# Patient Record
Sex: Male | Born: 1990 | Race: Black or African American | Hispanic: No | Marital: Single | Smoking: Smoker, current status unknown
Health system: Southern US, Community
[De-identification: ages and names within clinical notes are randomized; demographics above are authoritative.]

## PROBLEM LIST (undated history)

## (undated) DIAGNOSIS — Z789 Other specified health status: Secondary | ICD-10-CM

## (undated) HISTORY — PX: OTHER SURGICAL HISTORY: SHX169

---

## 2014-12-18 ENCOUNTER — Inpatient Hospital Stay (HOSPITAL_COMMUNITY)

## 2014-12-18 ENCOUNTER — Encounter (HOSPITAL_COMMUNITY): Payer: Self-pay | Admitting: Family Medicine

## 2014-12-18 ENCOUNTER — Inpatient Hospital Stay (HOSPITAL_COMMUNITY)
Admission: AD | Admit: 2014-12-18 | Discharge: 2014-12-18 | Disposition: A | Discharge status: Home / Self Care | Source: Ambulatory Visit | Attending: Family Medicine | Admitting: Family Medicine

## 2014-12-18 DIAGNOSIS — M25512 Pain in left shoulder: Secondary | ICD-10-CM

## 2014-12-18 DIAGNOSIS — S43085A Other dislocation of left shoulder joint, initial encounter: Secondary | ICD-10-CM | POA: Insufficient documentation

## 2014-12-18 DIAGNOSIS — X500XXA Overexertion from strenuous movement or load, initial encounter: Secondary | ICD-10-CM | POA: Insufficient documentation

## 2014-12-18 DIAGNOSIS — S43015A Anterior dislocation of left humerus, initial encounter: Secondary | ICD-10-CM

## 2014-12-18 DIAGNOSIS — Y929 Unspecified place or not applicable: Secondary | ICD-10-CM | POA: Insufficient documentation

## 2014-12-18 HISTORY — DX: Other specified health status: Z78.9

## 2014-12-18 MED ORDER — OXYCODONE-ACETAMINOPHEN 5-325 MG PO TABS
1.0000 | ORAL_TABLET | Freq: Once | ORAL | Status: AC
  Administered 2014-12-18: 1 via ORAL
  Filled 2014-12-18: qty 1

## 2014-12-18 MED ORDER — DIAZEPAM 5 MG/ML IJ SOLN
5.0000 mg | Freq: Once | INTRAMUSCULAR | Status: AC
Start: 2014-12-18 — End: 2014-12-18
  Administered 2014-12-18: 5 mg via INTRAVENOUS
  Filled 2014-12-18: qty 2

## 2014-12-18 MED ORDER — FENTANYL CITRATE (PF) 100 MCG/2ML IJ SOLN
50.0000 ug | Freq: Once | INTRAMUSCULAR | Status: AC
  Administered 2014-12-18: 50 ug via INTRAMUSCULAR
  Filled 2014-12-18: qty 2

## 2014-12-18 NOTE — Procedures (Deleted)
Time out performed. Patient premedicated with Valium 5mg  IM and Fentanyl 50mg   Placed in supine position. Left arm flexed at the elbow. Slow external rotation of the left shoulder. Audible pop with alleviation of pain after approximately 5 minutes of gentle pressure.   Patient able to fully flex and extend shoulder passively and actively after procedure. Neurovascularly intact.   Federico FlakeKimberly Niles Laiken Nohr, MD

## 2014-12-18 NOTE — MAU Note (Addendum)
Hurt shoulder at work. Swinging a Radio broadcast assistantsledge hammer.

## 2014-12-18 NOTE — MAU Provider Note (Signed)
CSN: [id number]     Arrival date & time 12/18/14  1626 History   Chief Complaint  Patient presents with  . Shoulder Injury   First Provider Initiated Contact with Patient 12/18/14 1708    HPI Christopher Perry is a 24 y.o. male presenting with left shoulder pain. He was breaking up concrete with a sledge hammer. He was coming down on the concrete and suddenly felt a pop and severe pain in his right shoulder. This has never happened before. No associated numbness and tingling. He reports inability to move arm 2/2 to pain. Sensation intact. He reports that the injury occurred 20 minutes prior to arrival.  He has not tried anything. His friend drove him directly here to [facility] hospital which was the closest hospital.    Past Medical History  Diagnosis Date  . Medical history non-contributory    Past Surgical History  Procedure Laterality Date  . None     Family History  Problem Relation Age of Onset  . Arthritis Father    Social History  Substance Use Topics  . Smoking status: Smoker, Current Status Unknown -- 1.00 packs/day for 5 years    Types: Cigarettes  . Smokeless tobacco: None  . Alcohol Use: 0.0 oz/week    0 Standard drinks or equivalent per week    Review of Systems  Constitutional: Negative for fever and chills.  HENT: Negative for congestion and sore throat.   Eyes: Negative for pain and visual disturbance.  Respiratory: Negative for cough, chest tightness and shortness of breath.   Cardiovascular: Negative for chest pain.  Gastrointestinal: Negative for nausea, vomiting, abdominal pain and diarrhea.  Endocrine: Negative for cold intolerance and heat intolerance.  Genitourinary: Negative for dysuria and flank pain.  Musculoskeletal: Negative for back pain and arthralgias.       +left  Shoulder pain  Skin: Negative for rash.  Allergic/Immunologic: Negative for food allergies.  Neurological: Negative for dizziness, weakness, light-headedness and numbness.   Psychiatric/Behavioral: Negative for agitation.    Allergies  Review of patient's allergies indicates no known allergies.  Home Medications   Prior to Admission medications   Not on File   BP 126/87 mmHg  Pulse 89  Temp(Src) 98.2 F (36.8 C) (Oral)  Resp 20  SpO2 100% Physical Exam Gen: Young AA male in acute distress, holding left arm with right. Well nourished.  CV: RR Lung: normal WOB  MSK:  Right shoulder: normal ROM. No TPP.  Left shoulder:  Loss of normal rounded nature to left shoulder. TTP anterior to shoulder. Decreased ROM, unable to flex/ext left shoulder. Able to flex/ext left elbow, wrist. Neurovascularly intact distal to injury.   ED Course  Procedures (including critical care time) CLOSED SHOULDER REDUCTION- see separate procedure note  Critical Care Total Time: 15 Minutes Critical care that was provided during the described time interval was provided by me and/or other providers on the [facility] team. This included time spent in examining the patient at the bedside, reviewing medical history and events of note, performing physical examination of the patient, evaluating lab work and other data, coordinating patient care on the floor or bedside, talking to patient/family members, communicating with nursing staff on the floor and subspecialists to coordinate this patient's complex medical care.   This patient is medically complex and is at high risk for sudden clinically significant deterioration which could be life threatening, and requires high complexity decision making. Federico Flake, MD   12/18/2014 6:19 PM   Labs  Review Labs Reviewed - No data to display  Imaging Review  DG Shoulder Left- Pre-reduction 12/18/2014  CLINICAL DATA:  Left shoulder injury while swinging a sledgehammer. Severe left shoulder pain. Initial encounter. EXAM: LEFT SHOULDER - 2+ VIEW COMPARISON:  None. FINDINGS: The left shoulder is anteriorly dislocated. No  fracture is identified. The acromioclavicular joint is intact. Imaged left lung and ribs are clear. IMPRESSION: Anterior left shoulder dislocation. Electronically Signed   By: Drusilla Kannerhomas  Dalessio M.D.   On: 12/18/2014 17:42   12/18/2014 7:15 PM  Official read not available.  Personally reviewed XR which shows that left shoulder is now in the appropriate anatomical location. No obvious deformities or fractures.    I have personally reviewed and evaluated these images and lab results as part of my medical decision-making.   MDM   Final diagnoses:  Anterior shoulder dislocation, left, initial encounter   Patient presented with classic s/sx and physical exam consistent with anterior shoulder dislocation. XR confirmed dislocation. Patient was medication and reduction performed in the ED. Patient with immediate relief after procedure. Repeat XR showed successful reduction. Patient sent home and instructed to use tylenol and ice prn. Reviewed activity precautions and PT for shoulder. Re[facility] in 2-3 weeks to assure he is healing well.

## 2014-12-18 NOTE — Discharge Instructions (Signed)
You can take Tylenol 500mg  every 6 hours as needed for pain Ice can also be very helpful Limit use of your shoulder for 1 week  Do not return to normal activities for 2 weeks After this two weeks you can start doing the activities below Please call [facility] to assure you are healing well.   You were given multiple strong medications. You should NOT drink alcohol for at least 24 hours.   Shoulder Dislocation A shoulder dislocation happens when the upper arm bone (humerus) moves out of the shoulder joint. The shoulder joint is the part of the shoulder where the humerus, shoulder blade (scapula), and collarbone (clavicle) meet. CAUSES This condition is often caused by:  A fall.  A hit to the shoulder.  A forceful movement of the shoulder. RISK FACTORS This condition is more likely to develop in people who play sports. SYMPTOMS Symptoms of this condition include:  Deformity of the shoulder.  Intense pain.  Inability to move the shoulder.  Numbness, weakness, or tingling in your neck or down your arm.  Bruising or swelling around your shoulder. DIAGNOSIS This condition is diagnosed with a physical exam. After the exam, tests may be done to check for related problems. Tests that may be done include:  X-ray. This may be done to check for broken bones.  MRI. This may be done to check for damage to the tissues around the shoulder.  Electromyogram. This may be done to check for nerve damage. TREATMENT This condition is treated with a procedure to place the humerus back in the joint. This procedure is called a reduction. There are two types of reduction:  Closed reduction. In this procedure, the humerus is placed back in the joint without surgery. The health care provider uses his or her hands to guide the bone back into place.  Open reduction. In this procedure, the humerus is placed back in the joint with surgery. An open reduction may be recommended if:  You have a  weak shoulder joint or weak ligaments.  You have had more than one shoulder dislocation.  The nerves or blood vessels around your shoulder have been damaged. After the humerus is placed back into the joint, your arm will be placed in a splint or sling to prevent it from moving. You will need to wear the splint or sling until your shoulder heals. When the splint or sling is removed, you may have physical therapy to help improve the range of motion in your shoulder joint. HOME CARE INSTRUCTIONS If You Have a Splint or Sling:  Wear it as told by your health care provider. Remove it only as told by your health care provider.  Loosen it if your fingers become numb and tingle, or if they turn cold and blue.  Keep it clean and dry. Bathing  Do not take baths, swim, or use a hot tub until your health care provider approves. Ask your health care provider if you can take showers. You may only be allowed to take sponge baths for bathing.  If your health care provider approves bathing and showering, cover your splint or sling with a watertight plastic bag to protect it from water. Do not let the splint or sling get wet. Managing Pain, Stiffness, and Swelling  If directed, apply ice to the injured area.  Put ice in a plastic bag.  Place a towel between your skin and the bag.  Leave the ice on for 20 minutes, 2-3 times per day.  Move your fingers often to avoid stiffness and to decrease swelling.  Raise (elevate) the injured area above the level of your heart while you are sitting or lying down. Driving  Do not drive while wearing a splint or sling on a hand that you use for driving.  Do not drive or operate heavy machinery while taking pain medicine. Activity  Return to your normal activities as told by your health care provider. Ask your health care provider what activities are safe for you.  Perform range-of-motion exercises only as told by your health care provider.  Exercise your  hand by squeezing a soft ball. This helps to decrease stiffness and swelling in your hand and wrist. General Instructions  Take over-the-counter and prescription medicines only as told by your health care provider.  Do not use any tobacco products, including cigarettes, chewing tobacco, or e-cigarettes. Tobacco can delay bone and tissue healing. If you need help quitting, ask your health care provider.  Keep all follow-up visits as told by your health care provider. This is important. SEEK MEDICAL CARE IF:  Your splint or sling gets damaged. SEEK IMMEDIATE MEDICAL CARE IF:  Your pain gets worse rather than better.  You lose feeling in your arm or hand.  Your arm or hand becomes white and cold.   This information is not intended to replace advice given to you by your health care provider. Make sure you discuss any questions you have with your health care provider.   Document Released: 09/16/2000 Document Revised: 09/12/2014 Document Reviewed: 04/16/2014 [employer] Interactive Patient Education 2016 [employer]  Anterior Shoulder Instability With Rehab Anterior shoulder instability is a condition that is characterized by recurrent dislocation of the shoulder joint. Dislocation is an injury in which two adjacent bones are no longer in proper alignment, and the joint surfaces are no longer touching. Subluxation is a similar injury to dislocation; however, the joint surfaces are still touching. Dislocations and subluxations of the shoulder joint (glenohumeral) most commonly involve the upper arm bone (humerus) displacing forward (anteriorly). The shoulder joint allows more motion than any other joint in the body, and because of this it is highly susceptible to injury. When the glenohumeral joint is dislocated or subluxated, the muscles that control the shoulder joint (rotator cuff) tendons become stretched. Repetitive injury results in the shoulder joint becoming loose and results in instability  of the shoulder joint. These injuries may also cause a tear in the cartilage that lines the joint and helps keep the humerus head in place (labrum). SYMPTOMS   Severe shoulder pain when the joint is dislocated or subluxated.  Shoulder weakness, pain, and/or inflammation.  Loss of shoulder function.  Pain that worsens with shoulder function, especially motions that involve arm movements above shoulder height.  Feeling of shoulder weakness or instability.  Signs of nerve damage: numbness or paralysis.  Crackling (crepitation) feeling and sound when the injured area is touched or with shoulder motion. CAUSES  Anterior shoulder instability is caused by injury to the glenohumeral joint that causes it to become dislocated or subluxated. Common mechanisms of injury include:  Direct trauma to the shoulder joint.  Repetitive and/or strenuous movements of the shoulder joint, especially those with the arm above shoulder height.  Sprain of one of the ligaments of the shoulder joint.  An abnormality you are born with (congenital). This includes a shallow or malformed joint surface. RISK INCREASES WITH:  Contact sports (football, wrestling, and basketball).  Activities that involve repetitive and/or strenuous movements  of the shoulder joint, especially those with the arm above shoulder height (baseball, volleyball, or swimming).  Previous shoulder injury.  Poor strength and flexibility. PREVENTION  Warm up and stretch properly before activity.  Allow for adequate recovery between workouts.  Maintain physical fitness:  Strength, flexibility, and endurance.  Cardiovascular fitness.  Learn and use proper technique. When possible, have a coach correct improper technique.  Wear properly fitted and padded protective equipment. PROGNOSIS  The extent of recovery and likelihood of future dislocations and subluxations depends on the extent of damage done to the shoulder.  RELATED  COMPLICATIONS   Damage to the nervous system or blood vessels that may cause weakness, paralysis, numbness, coldness, and paleness.  Damage to the bones or cartilage of the shoulder joint.  Permanent shoulder instability.  Tear of one or more of the rotator cuff tendons.  Arthritis of the shoulder. TREATMENT When the shoulder joint is dislocated it must be realigned (reduced) by someone who is trained in the procedure. Occasionally reduction cannot be performed manually, and requires surgery. After reduction, the use of ice and medication may help reduce pain and inflammation. The shoulder should be immobilized with a sling for 3 to 8 weeks to allow the joint to heal. After immobilization, it is important to perform strengthening and stretching exercises to help regain strength and a full range of motion. These exercises may be completed at home or with a therapist. Surgery is reserved for individuals who have sustained multiple shoulder dislocations due to shoulder instability. MEDICATION   General anesthesia or muscle relaxants may be necessary for reduction of the shoulder joint.  If pain medication is necessary, then nonsteroidal anti-inflammatory medications, such as aspirin and ibuprofen, or other minor pain relievers, such as acetaminophen, are often recommended.  Do not take pain medication within 7 days before surgery.  Prescription pain relievers may be given if deemed necessary by your caregiver. Use only as directed and only as much as you need. COLD THERAPY  Cold treatment (icing) relieves pain and reduces inflammation. Cold treatment should be applied for 10 to 15 minutes every 2 to 3 hours for inflammation and pain and immediately after any activity that aggravates your symptoms. Use ice packs or an ice massage. SEEK MEDICAL CARE IF:  Treatment seems to offer no benefit, or the condition worsens.  Any medications produce adverse side effects.  Any complications from  surgery occur:  Pain, numbness, or coldness in the extremity operated upon.  Discoloration of the nail beds (they become blue or gray) of the extremity operated upon.  Signs of infections (fever, pain, inflammation, redness, or persistent bleeding). EXERCISES RANGE OF MOTION (ROM) AND STRETCHING EXERCISES - Shoulder Instability, Anterior These exercises may help you restore your shoulder mobility once your physician has discontinued your 2-6 weeks of immobilization. Beginning these before your provider's approval may result in delayed healing. Your symptoms may resolve with or without further involvement from your physician, physical therapist or athletic trainer. While completing these exercises, remember:   Restoring tissue flexibility helps normal motion to return to the joints. This allows healthier, less painful movement and activity.  An effective stretch should be held for at least 30 seconds. A stretch should never be painful. You should only feel a gentle lengthening or release in the stretch.  During your recovery, avoid activity or exercises which involve actions that place your right / left hand or elbow above your head or behind your back or head. These positions stress the tissues  which are trying to heal. ROM - Pendulum  Bend at the waist so that your right / left arm falls away from your body. Support yourself with your opposite hand on a solid surface, such as a table or a countertop.  Your right / left arm should be perpendicular to the ground. If it is not perpendicular, you need to lean over farther. Relax the muscles in your right / left arm and shoulder as much as possible.  Gently sway your hips and trunk so they move your arm without any use of your right / left shoulder muscles.  Progress your movements so that your arm moves side to side, then forward and backward, and finally, both clockwise and counterclockwise.  Complete __________ repetitions in each direction.  Many people use this exercise to relieve discomfort in their shoulder as well as to gain range of motion. Repeat __________ times. Complete this exercise __________ times per day. STRETCH - Flexion, Seated  Sit in a firm chair so that your right / left forearm can rest on a table or on a table or countertop. Your elbow should rest below the height of your shoulder so that your shoulder feels supported and not tense or uncomfortable.  Keeping your right / left shoulder relaxed, lean forward at your waist, allowing your hand to slide forward. Bend forward until you feel a moderate stretch in your shoulder, but before you feel an increase in your pain.  Hold __________ seconds. Slowly return to your starting position. Repeat __________ times. Complete this exercise __________ times per day.  STRETCH - Flexion, Standing  Stand with good posture. With an underhand grip on your right / left hand and an overhand grip on the opposite hand, grasp a broomstick or cane so that your hands are a little more than shoulder-width apart.  Keeping your right / left elbow straight and shoulder muscles relaxed, push the stick with your opposite hand to raise your arm in front of your body and then overhead. Raise your arm until you feel a stretch in your right / left shoulder, but before you have increased shoulder pain.  Try to avoid shrugging your shoulder as your arm rises by keeping your shoulder blade tucked down and toward your mid-back spine. Hold __________ seconds.  Slowly return to the starting position. Repeat __________ times. Complete this exercise __________ times per day.  STRETCH - Abduction, Supine  Stand with good posture. With an underhand grip on your right / left hand and an overhand grip on the opposite hand, grasp a broomstick or cane so that your hands are a little more than shoulder-width apart.  Keeping your right / left elbow straight and shoulder muscles relaxed, push the stick with  your opposite hand to raise your right / left arm out to the side of your body and then overhead. Raise your arm until you feel a stretch in your shoulder, but before you have increased shoulder pain.  Try to avoid shrugging your shoulder as your arm rises by keeping your shoulder blade tucked down and toward your mid-back spine. Hold __________ seconds.  Slowly return to the starting position. Repeat __________ times. Complete this exercise __________ times per day.  ROM - Flexion, Active-Assisted  Lie on your back. You may bend your knees for comfort.  Grasp a broomstick or cane so your hands are about shoulder-width apart. Your right / left hand should grip the end of the stick/cane so that your hand is positioned "thumbs-up," as  if you were about to shake hands.  Using your healthy arm to lead, raise your right / left arm overhead until you feel a gentle stretch in your shoulder. Hold __________ seconds.  Use the stick/cane to assist in returning your arm to its starting position. Repeat __________ times. Complete this exercise __________ times per day.  STRETCH - Flexion, Standing  Stand facing a wall. Walk your right / left fingers up the wall until you feel a moderate stretch in your shoulder. As your hand gets higher, you may need to step closer to the wall or use a door frame to walk through.  Try to avoid shrugging your shoulder as your arm rises by keeping your shoulder blade tucked down and toward your mid-back spine.  Hold __________ seconds. Use your other hand, if needed, to ease out of the stretch and return to the starting position. Repeat __________ times. Complete this exercise __________ times per day.  STRETCH - External Rotation  Tuck a folded towel or small ball under your right / left upper arm. Grasp a broomstick or cane with an underhand grasp a little more than shoulder width apart. Bend your elbows to 90 degrees.  Stand with good posture or sit in a chair  without arms.  Use your strong arm to push the stick across your body. Do not allow the towel or ball to fall. This will rotate your right / left arm away from your abdomen. Using the stick turn/rotate your hand and forearm away from your body. Hold __________ seconds. Repeat __________ times. Complete this exercise __________ times per day.  STRENGTHENING EXERCISES - Shoulder Instability, Anterior These exercises may help you restore your shoulder strength once your physician has discontinued your 2-6 weeks of immobilization. When completing these exercises, do not raise your arm above shoulder-height until your physician, physical therapist or athletic trainer has instructed you to do so. Advancing your exercise before your provider's approval may result in delayed healing. While completing these exercises, remember:   Muscles can gain both the endurance and the strength needed for everyday activities through controlled exercises.  Complete these exercises as instructed by your physician, physical therapist or athletic trainer. Progress the resistance and repetitions only as guided.  You may experience muscle soreness or fatigue, but the pain or discomfort you are trying to eliminate should never worsen during these exercises. If this pain does worsen, stop and make certain you are following the directions exactly. If the pain is still present after adjustments, discontinue the exercise until you can discuss the trouble with your clinician.  During your recovery, avoid activity or exercises which involve actions that place your right / left hand or elbow above your head or behind your back or head. These positions stress the tissues which are trying to heal. STRENGTH - Scapular Depression and Adduction   With good posture, sit on a firm chair. Supported your arms in front of you with pillows, arm rests or a table top. Have your elbows in line with the sides of your body.  Gently draw your  shoulder blades down and toward your mid-back spine. Gradually increase the tension without tensing the muscles along the top of your shoulders and the back of your neck.  Hold for __________ seconds. Slowly release the tension and relax your muscles completely before completing the next repetition.  After you have practiced this exercise, remove the arm support and complete it in standing as well as sitting. Repeat __________ times. Complete this  exercise __________ times per day.  STRENGTH - Shoulder Flexion, Isometric  With good posture and facing a wall, stand or sit about 4-6 inches away.  Keeping your right / left elbow straight, gently press the top of your fist into the wall. Increase the pressure gradually until you are pressing as hard as you can without shrugging your shoulder or increasing any shoulder discomfort.  Hold __________ seconds.  Release the tension slowly. Relax your shoulder muscles completely before you the next repetition. Repeat __________ times. Complete this exercise __________ times per day.  STRENGTH - Shoulder Abductors, Isometric  With good posture, stand or sit about 4-6 inches from a wall with your right / left side facing the wall.  Bend your right / left elbow. Gently press your elbow into the wall. Increase the pressure gradually until you are pressing as hard as you can without shrugging your shoulder or increasing any shoulder discomfort.  Hold __________ seconds.  Release the tension slowly. Relax your shoulder muscles completely before you the next repetition. Repeat __________ times. Complete this exercise __________ times per day.  STRENGTH - Internal Rotators, Isometric  Keep your right / left elbow at your side and bend it 90 degrees.  Step into a door frame so that the inside of your right / left wrist can press against the door frame without your upper arm leaving your side.  Gently press your right / left wrist into the door frame as  if you were trying to draw the palm of your hand to your abdomen. Gradually increase the tension until you are pressing as hard as you can without shrugging your shoulder or increasing any shoulder discomfort.  Hold __________ seconds.  Release the tension slowly. Relax your shoulder muscles completely before you the next repetition. Repeat __________ times. Complete this exercise __________ times per day.  STRENGTH - External Rotators, Isometric  Keep your right / left elbow at your side and bend it 90 degrees.  Step into a door frame so that the outside of your right / left wrist can press against the door frame without your upper arm leaving your side.  Gently press your right / left wrist into the door frame as if you were trying to swing the back of your hand away from your abdomen. Gradually increase the tension until you are pressing as hard as you can without shrugging your shoulder or increasing any shoulder discomfort.  Hold __________ seconds.  Release the tension slowly. Relax your shoulder muscles completely before you the next repetition. Repeat __________ times. Complete this exercise __________ times per day.  STRENGTH - Scapular Protractors, Standing  Stand arms-length away from a wall. Place your hands on the wall, keeping your elbows straight.  Begin by dropping your shoulder blades down and toward your mid-back spine.  To strengthen your protractors, keep your shoulder blades down, but slide them forward on your rib cage. It will feel as if you are lifting the back of your rib cage away from the wall. This is a subtle motion and can be challenging to complete. Ask your clinician for further instruction if you are not sure you are doing the exercise correctly.  Hold for __________ seconds. Slowly return to the starting position, resting the muscles completely before completing the next repetition. Repeat __________ times. Complete this exercise __________ times per  day. STRENGTH - Scapular Protractors, Supine  Lie on your back on a firm surface. Extend your right / left arm straight into the  air while holding a __________ weight in your hand.  Keeping your head and back in place, lift your shoulder off the floor.  Hold __________ seconds. Slowly return to the starting position and allow your muscles to relax completely before completing the next repetition. Repeat __________ times. Complete this exercise __________ times per day. STRENGTH - Scapular Protractors, Quadruped  Get onto your hands and knees with your shoulders directly over your hands (or as close as you comfortably can be).  Keeping your elbows locked, lift the back of your rib cage up into your shoulder blades so your mid-back rounds-out. Keep your neck muscles relaxed.  Hold this position for __________ seconds. Slowly return to the starting position and allow your muscles to relax completely before completing the next repetition. Repeat __________ times. Complete this exercise __________ times per day.  STRENGTH - Shoulder Extensors, Prone  Lie on your stomach on a firm surface so that your right / left arm overhangs the edge. Rest your forehead on your opposite forearm. With your thumb facing away from your body and your elbow straight, hold a __________ weight in your hand.  Squeeze your right / left shoulder blade to your mid-back spine and then slowly raise your arm behind you to the height of the bed.  Hold for __________ seconds. Slowly reverse the directions and return to the starting position, controlling the weight as you lower your arm. Repeat __________ times. Complete this exercise __________ times per day.  STRENGTH - Shoulder Flexion  Stand or sit with good posture. Grasp a __________ weight or an exercise band/tubing so that your hand is "thumbs-up," like when you shake hands.  Slowly lift your right / left arm as far as you can without increasing any shoulder pain.  Initially, many people can only raise their hand to shoulder height.  Avoid shrugging your shoulder as your arm rises by keeping your shoulder blade tucked down and toward your mid-back spine.  Hold for __________ seconds. Control the descent of your hand as you slowly return to your starting position. Repeat __________ times. Complete this exercise __________ times per day.  STRENGTH - Scapular Retractors  Secure a rubber exercise band/tubing so that it is at the height of your shoulders when you are either standing or sitting on a firm arm-less chair.  With a palm-down grip, grasp an end of the band/tubing in each hand. Straighten your elbows and lift your hands straight in front of you at shoulder height. Step back away from the secured end of band/tubing until it becomes tense.  Squeezing your shoulder blades together, draw your elbows back as you bend them. Keep your upper arm lifted away from your body throughout the exercise.  Hold __________ seconds. Slowly ease the tension on the band/tubing as you reverse the directions and return to the starting position. Repeat __________ times. Complete this exercise __________ times per day. STRENGTH - Internal Rotators  Secure a rubber exercise band/tubing to a fixed object so that it is at the same height as your right / left elbow when you are standing or sitting on a firm surface.  Stand or sit so that the secured exercise band/tubing is at your right / left side.  Bend your elbow 90 degrees. Place a folded towel or small pillow under your right / left arm so that your elbow is a few inches away from your side.  Keeping the tension on the exercise band/tubing, pull it across your body toward your abdomen. Be  sure to keep your body steady so that the movement is only coming from your shoulder rotating.  Hold __________ seconds. Release the tension in a controlled manner as you return to the starting position. Repeat __________ times.  Complete this exercise __________ times per day.  STRENGTH - External Rotators  Secure a rubber exercise band/tubing to a fixed object so that it is at the same height as your right / left elbow when you are standing or sitting on a firm surface.  Stand or sit so that the secured exercise band/tubing is at your side that is not injured.  Bend your elbow 90 degrees. Place a folded towel or small pillow under your right / left arm so that your elbow is a few inches away from your side.  Keeping the tension on the exercise band/tubing, pull it away from your body, as if pivoting on your elbow. Be sure to keep your body steady so that the movement is only coming from your shoulder rotating.  Hold __________ seconds. Release the tension in a controlled manner as you return to the starting position. Repeat __________ times. Complete this exercise __________ times per day.    This information is not intended to replace advice given to you by your health care provider. Make sure you discuss any questions you have with your health care provider.   Document Released: 12/22/2004 Document Revised: 09/12/2014 Document Reviewed: 04/05/2008 [employer] Interactive Patient Education [employer]

## 2014-12-18 NOTE — Procedures (Signed)
Timeout performed Patient premedicated with Valium 5mg  and Fentanyl 50mcg  Shirt was cut per patient preference to allow visualization of anterior shoulder. Patient placed in supine position. Left elbow flexed. Shoulder gently externally rotated with gentle traction. After about 5 minutes audible pop was heard and patient expressed immediate pain relief.   Patient able to fully flex and extend left shoulder both passively and actively.  Ordered post reduction films.  Federico FlakeKimberly Niles Eeshan Verbrugge, MD

## 2014-12-18 NOTE — Progress Notes (Signed)
Assumed care of patient from Jolaine ArtistG. Morris, Charity fundraiserN. Dr. Alvester MorinNewton has seen patient and ordered meds.

## 2017-07-15 IMAGING — CR DG SHOULDER 2+V*L*
2 series · 2 of 2 positions shown · non-contrast
Comparison: None.

CLINICAL DATA: Left shoulder injury while swinging a sledgehammer.
Severe left shoulder pain. Initial encounter.

EXAM:
LEFT SHOULDER - 2+ VIEW

[shoulder ap]
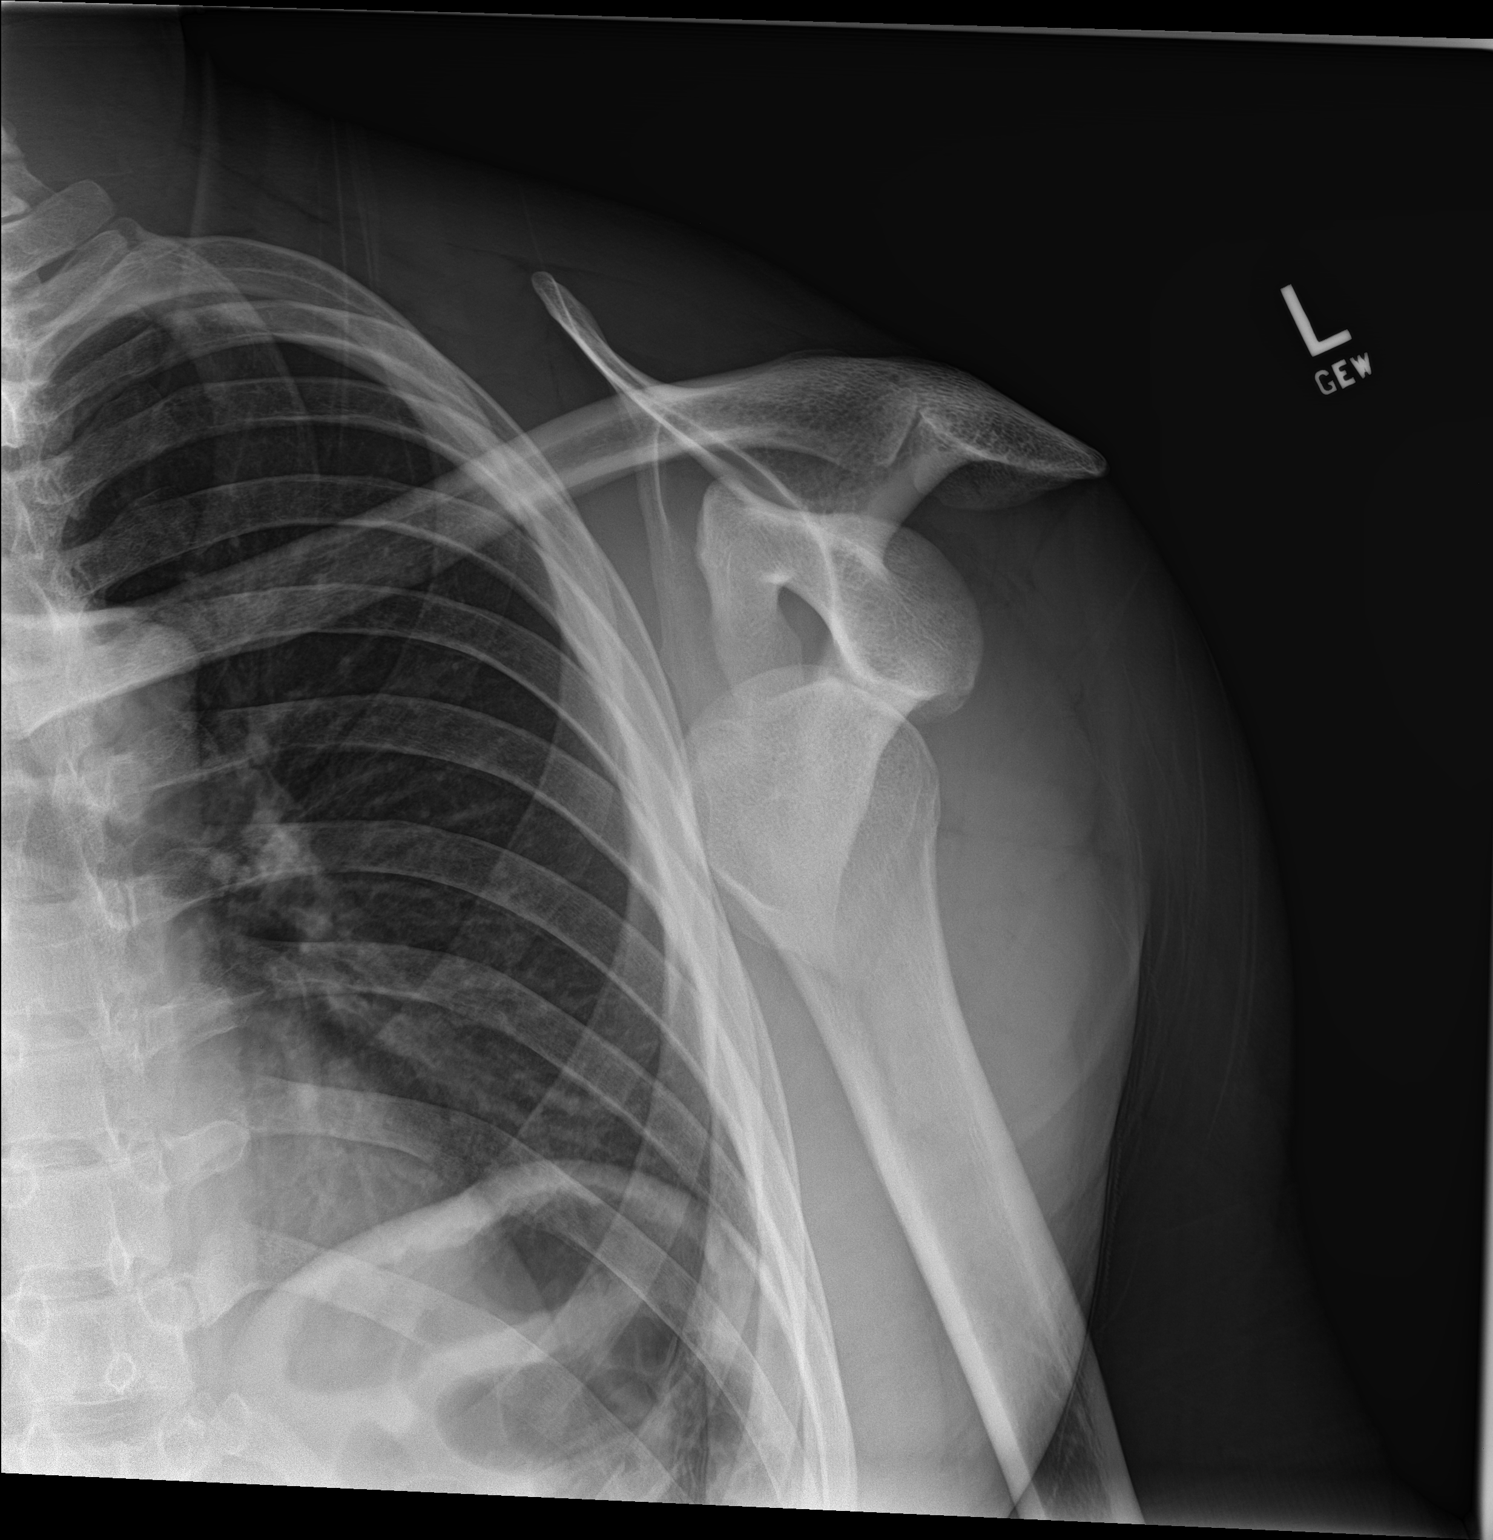

[shoulder y view]
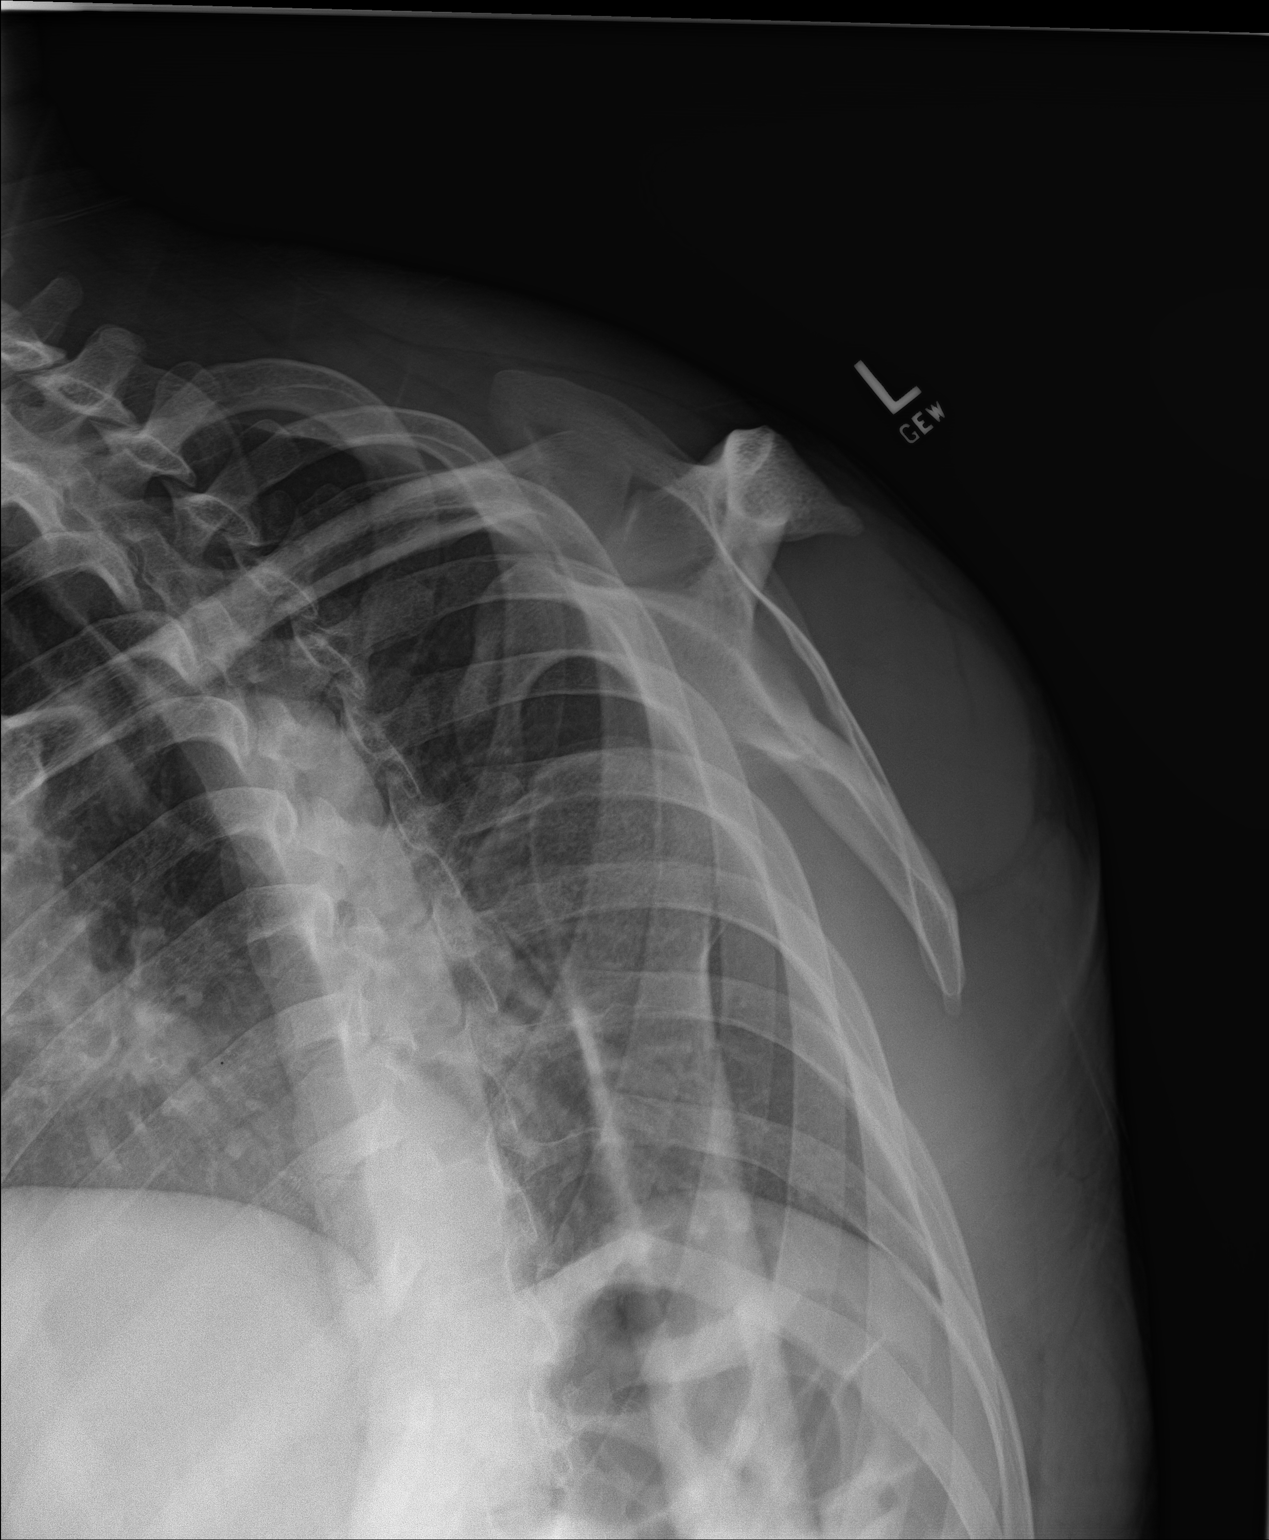

[2 of 2 positions shown; findings below may reference images not displayed]

FINDINGS: The left shoulder is anteriorly dislocated. No fracture is
identified. The acromioclavicular joint is intact. Imaged left lung
and ribs are clear.
IMPRESSION: Anterior left shoulder dislocation.
